# Patient Record
Sex: Male | Born: 1975 | Race: Black or African American | Hispanic: No | Marital: Married
Health system: Southern US, Community
[De-identification: ages and names within clinical notes are randomized; demographics above are authoritative.]

## PROBLEM LIST (undated history)

## (undated) HISTORY — PX: HERNIA REPAIR: SHX51

---

## 2014-08-28 ENCOUNTER — Encounter (HOSPITAL_BASED_OUTPATIENT_CLINIC_OR_DEPARTMENT_OTHER): Payer: Self-pay

## 2014-08-28 ENCOUNTER — Emergency Department (HOSPITAL_BASED_OUTPATIENT_CLINIC_OR_DEPARTMENT_OTHER)
Admission: EM | Admit: 2014-08-28 | Discharge: 2014-08-29 | Disposition: A | Discharge status: Home / Self Care | Payer: BLUE CROSS/BLUE SHIELD | Attending: Emergency Medicine | Admitting: Emergency Medicine

## 2014-08-28 DIAGNOSIS — Y9389 Activity, other specified: Secondary | ICD-10-CM | POA: Diagnosis not present

## 2014-08-28 DIAGNOSIS — Y998 Other external cause status: Secondary | ICD-10-CM | POA: Insufficient documentation

## 2014-08-28 DIAGNOSIS — T18108A Unspecified foreign body in esophagus causing other injury, initial encounter: Secondary | ICD-10-CM

## 2014-08-28 DIAGNOSIS — Y929 Unspecified place or not applicable: Secondary | ICD-10-CM | POA: Diagnosis not present

## 2014-08-28 DIAGNOSIS — X58XXXA Exposure to other specified factors, initial encounter: Secondary | ICD-10-CM | POA: Insufficient documentation

## 2014-08-28 DIAGNOSIS — T189XXA Foreign body of alimentary tract, part unspecified, initial encounter: Secondary | ICD-10-CM | POA: Diagnosis not present

## 2014-08-28 MED ORDER — GLUCAGON HCL RDNA (DIAGNOSTIC) 1 MG IJ SOLR: 1.0000 mg | Freq: Once | INTRAMUSCULAR | Status: DC | PRN

## 2014-08-28 MED ORDER — GI COCKTAIL ~~LOC~~
30.0000 mL | Freq: Once | ORAL | Status: AC
  Administered 2014-08-29: 30 mL via ORAL
  Filled 2014-08-28: qty 30

## 2014-08-28 NOTE — ED Notes (Signed)
Pt alert, NAD, calm and mildly anxious, interactive, resps e/u, no dyspnea noted, speech clear, pt clutching emesis bag, intermittent gagging, scant clear emesis (saliva), onset while eating crab, "feel like shell or crab stuck in throat". Given ice water per Dr. Nicanor Alcon (PO challenge).

## 2014-08-28 NOTE — ED Notes (Signed)
Dr. Palumbo at [facility] 

## 2014-08-28 NOTE — ED Notes (Signed)
Pt reports was eating crab and got fin stuck in back of throat, vomited x 2 since this time.  Denies difficulty breathing.

## 2014-08-28 NOTE — ED Provider Notes (Signed)
CSN: [id number]     Arrival date & time 08/28/14  2341 History  This chart was scribed for Adayah Arocho, MD by Ronney Lion, ED Scribe. This patient was seen in room MH11/[facility]11 and the patient's care was started at 11:46 PM.    Chief Complaint  Patient presents with  . Esophageal Foreign Body    Patient is a 39 y.o. male presenting with foreign body swallowed. The history is provided by the patient. No language interpreter was used.  Swallowed Foreign Body This is a new problem. The current episode started 3 to 5 hours ago. The problem occurs constantly. The problem has not changed since onset.Pertinent negatives include no chest pain, no abdominal pain, no headaches and no shortness of breath. Nothing aggravates the symptoms. Nothing relieves the symptoms. He has tried nothing for the symptoms. The treatment provided no relief.    HPI Comments: Sean Moses is a 39 y.o. male who presents to the Emergency Department complaining of an esophageal foreign body. Patient states he was eating crab when he got a crab piece stuck in the back of his throat about 2 hours ago. He also complains of 2 episodes of vomiting - the crab piece first made him vomit, and he tried to get the piece out by making himself vomit. He states he last had a tetanus shot 2-3 years ago.  History reviewed. No pertinent past medical history. Past Surgical History  Procedure Laterality Date  . Hernia repair     History reviewed. No pertinent family history. History  Substance Use Topics  . Smoking status: Not on file  . Smokeless tobacco: Not on file  . Alcohol Use: Yes     Comment: occ    Review of Systems  HENT: Negative for drooling.   Respiratory: Negative for shortness of breath.   Cardiovascular: Negative for chest pain.  Gastrointestinal: Positive for vomiting (induced by foreign body/self). Negative for abdominal pain.  Neurological: Negative for headaches.  All other systems reviewed and are  negative.  Allergies  Review of patient's allergies indicates no known allergies.  Home Medications   Prior to Admission medications   Not on File   BP 134/74 mmHg  Pulse 101  Temp(Src) 98.1 F (36.7 C) (Oral)  Resp 21  Ht 5\' 11"  (1.803 m)  Wt 170 lb (77.111 kg)  BMI 23.72 kg/m2  SpO2 100% Physical Exam  Constitutional: He is oriented to person, place, and time. He appears well-developed and well-nourished. No distress.  HENT:  Head: Normocephalic and atraumatic.  Mouth/Throat: Oropharynx is clear and moist. No oropharyngeal exudate.  No oral trauma no abrasions no swelling of the lips tongue or uvula  Eyes: Conjunctivae and EOM are normal. Pupils are equal, round, and reactive to light.  Neck: Normal range of motion. Neck supple. No tracheal deviation present.  Trachea is midline. No crepitance of the soft tissues  Cardiovascular: Normal rate, regular rhythm and normal heart sounds.  Exam reveals no gallop and no friction rub.   No murmur heard. Pulmonary/Chest: Effort normal. No stridor. No respiratory distress. He has no wheezes. He has no rales. He exhibits no tenderness.  Lungs are clear to auscultation.   Abdominal: Soft. Bowel sounds are normal. There is no tenderness. There is no rebound and no guarding.  Musculoskeletal: Normal range of motion.  Neurological: He is alert and oriented to person, place, and time. He has normal reflexes.  Skin: Skin is warm and dry.  Psychiatric: He has a normal mood  and affect. His behavior is normal.  Nursing note and vitals reviewed.   ED Course  Procedures (including critical care time)  DIAGNOSTIC STUDIES: Oxygen Saturation is 100% on RA, normal by my interpretation.    COORDINATION OF CARE: 11:55 PM - Discussed treatment plan with pt at bedside which includes XR and GI cocktail, and pt agreed to plan.   Labs Review Labs Reviewed - No data to display  Imaging Review No results found.  MDM   Final diagnoses:  None    No air in the soft tissues, no radio-opaque foreign body tolerating PO.  Will prescribe carafate.  Bland soft diet x 7 days and close follow up with GI strict return precautions given.    I personally performed the services described in this documentation, which was scribed in my presence. The recorded information has been reviewed and is accurate.     Cy Blamer, MD 08/29/14 [phone]

## 2014-08-29 ENCOUNTER — Emergency Department (HOSPITAL_BASED_OUTPATIENT_CLINIC_OR_DEPARTMENT_OTHER): Payer: BLUE CROSS/BLUE SHIELD

## 2014-08-29 MED ORDER — SUCRALFATE 1 GM/10ML PO SUSP: 1.0000 g | Freq: Three times a day (TID) | ORAL | Status: AC

## 2014-08-29 NOTE — ED Notes (Signed)
Waiting on GI cocktail and glucagon until after xray and PO challenge, not given yet per Dr. Nicanor Alcon.

## 2014-08-29 NOTE — ED Notes (Signed)
Tolerating PO fluids, no emesis, "felt like it wanted to come up, but didn't". Pending xray.

## 2014-08-29 NOTE — ED Notes (Addendum)
Downtime over, see paper charting for previous documentation:  Pt "feels better" after GI cocktail, tolerating PO fluids, no emesis, gagging, coughing, dyspnea or choking. Family at [facility]. Pending re-eval.

## 2016-09-15 IMAGING — DX DG NECK SOFT TISSUE
2 series · 2 of 2 positions shown · non-contrast
Comparison: None.

CLINICAL DATA: Right-sided neck discomfort with swallowing.
Swallowed shell of a crab this morning. Initial encounter.

EXAM:
NECK SOFT TISSUES - 1+ VIEW

[neck lat]
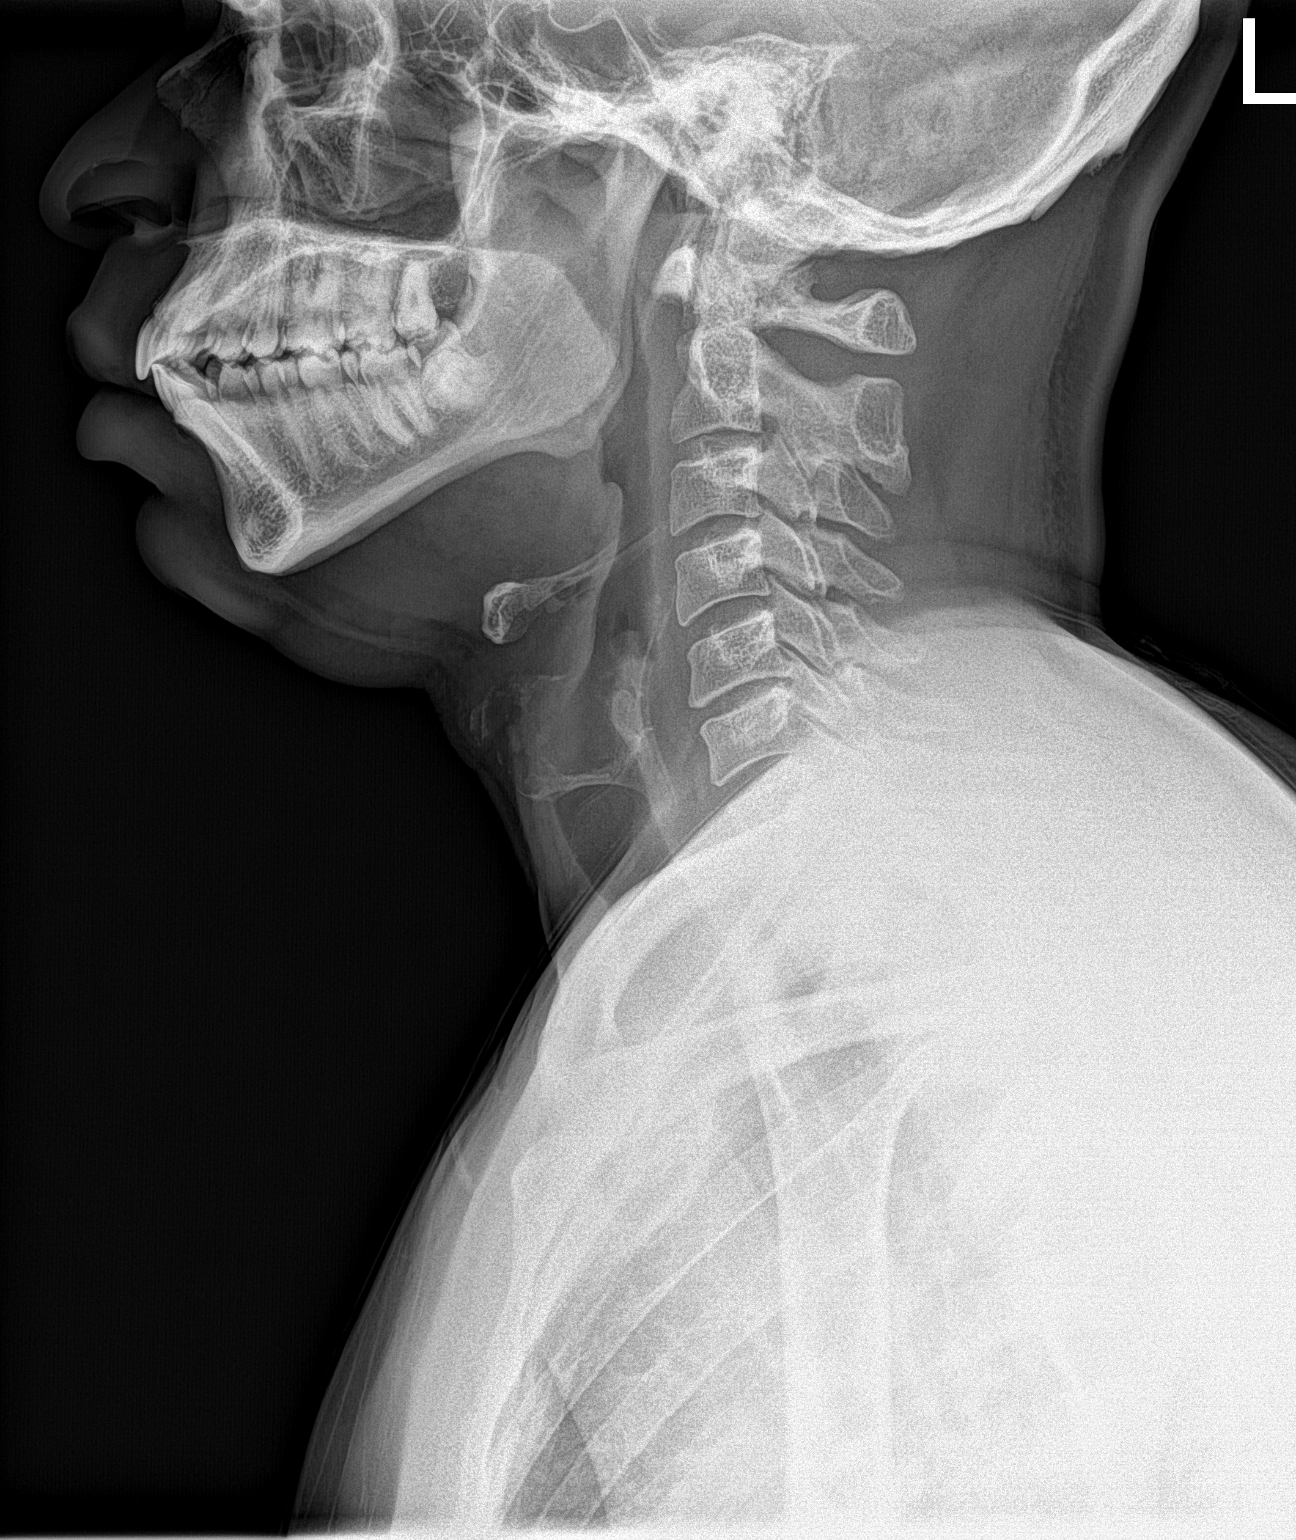

[neck ap]
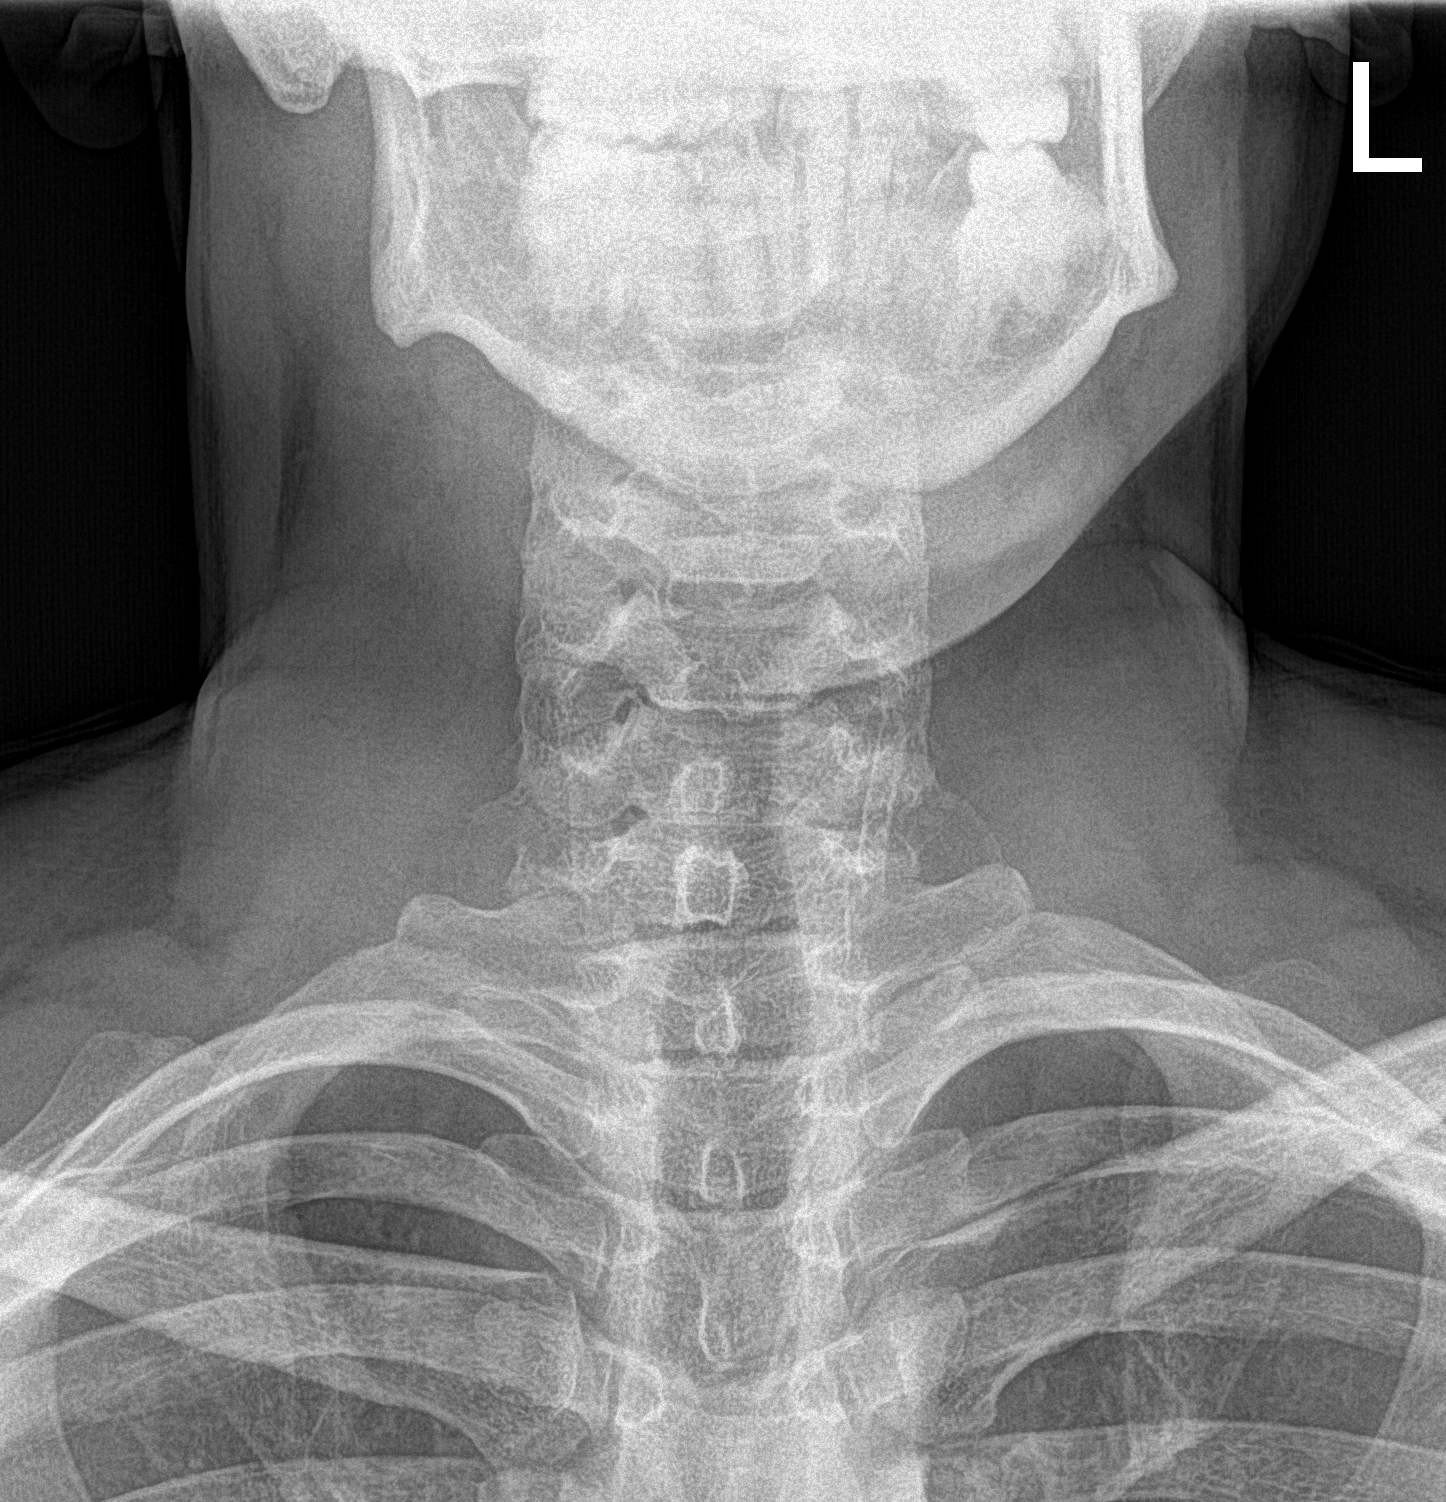

[2 of 2 positions shown; findings below may reference images not displayed]

FINDINGS: The nasopharynx, oropharynx and hypopharynx are grossly
unremarkable. Prevertebral soft tissues are within normal limits.
The epiglottis is normal in thickness. The proximal trachea is
unremarkable. No definite radiopaque foreign body is identified.

The visualized osseous structures are grossly unremarkable in
appearance. The visualized paranasal sinuses and mastoid air cells
are well-aerated.
IMPRESSION: No radiopaque foreign bodies seen. Visualized soft tissues are
unremarkable in appearance.
